# Patient Record
Sex: Male | Born: 2000 | Race: White | Hispanic: No | Marital: Single | Smoking: Never smoker
Health system: Southern US, Community
[De-identification: ages and names within clinical notes are randomized; demographics above are authoritative.]

---
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
## 2020-05-30 ENCOUNTER — Other Ambulatory Visit: Payer: Self-pay

## 2020-05-30 ENCOUNTER — Ambulatory Visit
Admission: EM | Admit: 2020-05-30 | Discharge: 2020-05-30 | Disposition: A | Discharge status: Home / Self Care | Payer: BC Managed Care – PPO | Attending: Family Medicine | Admitting: Family Medicine

## 2020-05-30 DIAGNOSIS — J039 Acute tonsillitis, unspecified: Secondary | ICD-10-CM | POA: Insufficient documentation

## 2020-05-30 DIAGNOSIS — Z1152 Encounter for screening for COVID-19: Secondary | ICD-10-CM

## 2020-05-30 LAB — GROUP A STREP BY PCR: Group A Strep by PCR: NOT DETECTED

## 2020-05-30 MED ORDER — AZITHROMYCIN 250 MG PO TABS: ORAL_TABLET | ORAL | 0 refills | Status: AC

## 2020-05-30 MED ORDER — PREDNISONE 50 MG PO TABS: ORAL_TABLET | ORAL | 0 refills | Status: AC

## 2020-05-30 MED ORDER — LIDOCAINE VISCOUS HCL 2 % MT SOLN: OROMUCOSAL | 0 refills | Status: AC

## 2020-05-30 NOTE — Discharge Instructions (Signed)
You need to drink lots of fluids.  Medication as prescribed.  Take care  Dr. Adriana Simas

## 2020-05-30 NOTE — ED Triage Notes (Addendum)
Patient presents to Urgent Care with complaints of HA since on Weds and on Friday woke up with sore throat, nasal congestion.  Reports low grade fever started yesterday. Treating symptoms with Mucinex, tylenol, OTC sore throat meds. Had a negative rapid covid test Friday.

## 2020-05-30 NOTE — ED Provider Notes (Signed)
[facility]    CSN: [id number] Arrival date & time: 05/30/20  2774      History   Chief Complaint Chief Complaint  Patient presents with  . Sore Throat  . Headache   HPI  20 year old male presents with the above complaints.  Patient states that he developed headache on Wednesday.  Subsequently developed sore throat and congestion on Friday.  He reports severe sore throat.  Difficulty swallowing.  He is not taking much by mouth.  He has taken some over-the-counter Mucinex, Tylenol, and used nasal saline without resolution.  His sore throat is worse when he swallows.  Has recently had a negative Covid test.  Pain currently 8/10 in severity.  No other reported symptoms.  No other complaints.  Home Medications    Prior to Admission medications   Medication Sig Start Date End Date Taking? Authorizing Provider  azithromycin (ZITHROMAX) 250 MG tablet 2 tablets on day 1, then 1 tablet daily on days 2-5. 05/30/20  Yes Kellan Boehlke G, DO  lidocaine (XYLOCAINE) 2 % solution Gargle 15 mL every 3 hours as needed. May swallow if desired. 05/30/20  Yes Eduarda Scrivens G, DO  predniSONE (DELTASONE) 50 MG tablet 1 tablet daily x 5 days 05/30/20  Yes Tommie Sams, DO    Family History Family History  Problem Relation Age of Onset  . Healthy Mother   . Healthy Father     Social History Social History   Tobacco Use  . Smoking status: Never Smoker  . Smokeless tobacco: Never Used  Vaping Use  . Vaping Use: Some days  Substance Use Topics  . Alcohol use: Yes    Comment: social drinker   . Drug use: Never     Allergies   Patient has no known allergies.   Review of Systems Review of Systems  HENT: Positive for congestion and sore throat.   Neurological: Positive for headaches.   Physical Exam Triage Vital Signs ED Triage Vitals  Enc Vitals Group     BP 05/30/20 0841 132/68     Pulse Rate 05/30/20 0841 64     Resp 05/30/20 0838 16     Temp 05/30/20 0841 97.6 F (36.4  C)     Temp Source 05/30/20 0841 Oral     SpO2 05/30/20 0841 99 %     Weight 05/30/20 0840 170 lb (77.1 kg)     Height --      Head Circumference --      Peak Flow --      Pain Score 05/30/20 0840 8     Pain Loc --      Pain Edu? --      Excl. in GC? --    Updated Vital Signs BP 132/68 (BP Location: Right Arm)   Pulse 64   Temp 97.6 F (36.4 C) (Oral)   Resp 16   Wt 77.1 kg   SpO2 99%   Visual Acuity Right Eye Distance:   Left Eye Distance:   Bilateral Distance:    Right Eye Near:   Left Eye Near:    Bilateral Near:     Physical Exam Vitals and nursing note reviewed.  Constitutional:      General: He is not in acute distress. HENT:     Head: Normocephalic and atraumatic.     Ears:     Comments: Right TM with effusion.  Left TM normal.    Nose: Congestion present.     Mouth/Throat:  Pharynx: Uvula midline. Posterior oropharyngeal erythema present.     Tonsils: Tonsillar exudate present. 3+ on the right. 3+ on the left.  Cardiovascular:     Rate and Rhythm: Normal rate and regular rhythm.  Pulmonary:     Effort: Pulmonary effort is normal.     Breath sounds: Normal breath sounds. No wheezing, rhonchi or rales.  Neurological:     Mental Status: He is alert.  Psychiatric:        Mood and Affect: Mood normal.        Behavior: Behavior normal.    UC Treatments / Results  Labs (all labs ordered are listed, but only abnormal results are displayed) Labs Reviewed  GROUP A STREP BY PCR  SARS CORONAVIRUS 2 (TAT 6-24 HRS)    EKG   Radiology No results found.  Procedures Procedures (including critical care time)  Medications Ordered in UC Medications - No data to display  Initial Impression / Assessment and Plan / UC Course  I have reviewed the triage vital signs and the nursing notes.  Pertinent labs & imaging results that were available during my care of the patient were reviewed by me and considered in my medical decision making (see chart for  details).    20 year old male presents with exudative tonsillitis.  Strep negative.  Awaiting Covid test result.  Mono versus atypical bacteria/other strep species.  Did not proceed with mono test today as I anticipate it will be negative given the short duration of his symptoms.  Placing empirically on azithromycin.  Prednisone given severe pharyngitis.  Viscous lidocaine as directed.  Advised to drink lots of fluids.  If he is unable to do so, he will need to go to the ER for intravenous fluids.  Final Clinical Impressions(s) / UC Diagnoses   Final diagnoses:  Encounter for screening for COVID-19  Exudative tonsillitis     Discharge Instructions     You need to drink lots of fluids.  Medication as prescribed.  Take care  Dr. Adriana Simas     ED Prescriptions    Medication Sig Dispense Auth. Provider   azithromycin (ZITHROMAX) 250 MG tablet 2 tablets on day 1, then 1 tablet daily on days 2-5. 6 tablet Mayerli Kirst G, DO   predniSONE (DELTASONE) 50 MG tablet 1 tablet daily x 5 days 5 tablet Janzen Sacks G, DO   lidocaine (XYLOCAINE) 2 % solution Gargle 15 mL every 3 hours as needed. May swallow if desired. 200 mL Tommie Sams, DO     PDMP not reviewed this encounter.   Tommie Sams, [place] 05/30/20 [phone]

## 2020-05-31 LAB — SARS CORONAVIRUS 2 (TAT 6-24 HRS): SARS Coronavirus 2: POSITIVE — AB

## 2020-06-01 ENCOUNTER — Other Ambulatory Visit (HOSPITAL_COMMUNITY): Payer: Self-pay | Admitting: Adult Health

## 2020-06-01 DIAGNOSIS — U071 COVID-19: Secondary | ICD-10-CM

## 2020-06-01 NOTE — Progress Notes (Signed)
I connected by phone with Johnny Young on 06/01/2020 at 12:53 PM to discuss the potential use of a new treatment for mild to moderate COVID-19 viral infection in non-hospitalized patients.  This patient is a 20 y.o. male that meets the FDA criteria for Emergency Use Authorization of COVID monoclonal antibody sotrovimab.  Has a (+) direct SARS-CoV-2 viral test result  Has mild or moderate COVID-19   Is NOT hospitalized due to COVID-19  Is within 10 days of symptom onset  Has at least one of the high risk factor(s) for progression to severe COVID-19 and/or hospitalization as defined in EUA.  Specific high risk criteria : BMI > 25   Sx onset 2/24   I have spoken and communicated the following to the patient or parent/caregiver regarding COVID monoclonal antibody treatment:  1. FDA has authorized the emergency use for the treatment of mild to moderate COVID-19 in adults and pediatric patients with positive results of direct SARS-CoV-2 viral testing who are 99 years of age and older weighing at least 40 kg, and who are at high risk for progressing to severe COVID-19 and/or hospitalization.  2. The significant known and potential risks and benefits of COVID monoclonal antibody, and the extent to which such potential risks and benefits are unknown.  3. Information on available alternative treatments and the risks and benefits of those alternatives, including clinical trials.  4. Patients treated with COVID monoclonal antibody should continue to self-isolate and use infection control measures (e.g., wear mask, isolate, social distance, avoid sharing personal items, clean and disinfect "high touch" surfaces, and frequent handwashing) according to CDC guidelines.   5. The patient or parent/caregiver has the option to accept or refuse COVID monoclonal antibody treatment.  After reviewing this information with the patient, the patient has agreed to receive one of the available covid 19 monoclonal  antibodies and will be provided an appropriate fact sheet prior to infusion. Noreene Filbert, NP 06/01/2020 12:53 PM

## 2020-06-02 ENCOUNTER — Ambulatory Visit (HOSPITAL_COMMUNITY)
Admission: RE | Admit: 2020-06-02 | Discharge: 2020-06-02 | Disposition: A | Discharge status: Home / Self Care | Payer: BC Managed Care – PPO | Source: Ambulatory Visit | Attending: Pulmonary Disease | Admitting: Pulmonary Disease

## 2020-06-02 DIAGNOSIS — Z6825 Body mass index (BMI) 25.0-25.9, adult: Secondary | ICD-10-CM | POA: Insufficient documentation

## 2020-06-02 DIAGNOSIS — U071 COVID-19: Secondary | ICD-10-CM | POA: Diagnosis present

## 2020-06-02 MED ORDER — SODIUM CHLORIDE 0.9 % IV SOLN: INTRAVENOUS | Status: DC | PRN

## 2020-06-02 MED ORDER — EPINEPHRINE 0.3 MG/0.3ML IJ SOAJ: 0.3000 mg | Freq: Once | INTRAMUSCULAR | Status: DC | PRN

## 2020-06-02 MED ORDER — FAMOTIDINE IN NACL 20-0.9 MG/50ML-% IV SOLN: 20.0000 mg | Freq: Once | INTRAVENOUS | Status: DC | PRN

## 2020-06-02 MED ORDER — SODIUM CHLORIDE 0.9 % IV BOLUS
1000.0000 mL | Freq: Once | INTRAVENOUS | Status: AC
  Administered 2020-06-02: 1000 mL via INTRAVENOUS

## 2020-06-02 MED ORDER — SOTROVIMAB 500 MG/8ML IV SOLN
500.0000 mg | Freq: Once | INTRAVENOUS | Status: AC
  Administered 2020-06-02: 500 mg via INTRAVENOUS

## 2020-06-02 MED ORDER — DIPHENHYDRAMINE HCL 50 MG/ML IJ SOLN: 50.0000 mg | Freq: Once | INTRAMUSCULAR | Status: DC | PRN

## 2020-06-02 MED ORDER — METHYLPREDNISOLONE SODIUM SUCC 125 MG IJ SOLR: 125.0000 mg | Freq: Once | INTRAMUSCULAR | Status: DC | PRN

## 2020-06-02 MED ORDER — ALBUTEROL SULFATE HFA 108 (90 BASE) MCG/ACT IN AERS: 2.0000 | INHALATION_SPRAY | Freq: Once | RESPIRATORY_TRACT | Status: DC | PRN

## 2020-06-02 MED FILL — MAGIC MW (LID/NYS/MAA/BEN): 12 days supply | Qty: 240 | Fill #0

## 2020-06-02 NOTE — Progress Notes (Signed)
Diagnosis: COVID-19  Physician: Dr. Patrick Wright  Procedure: Covid Infusion Clinic Med: Sotrovimab infusion - Provided patient with sotrovimab fact sheet for patients, parents, and caregivers prior to infusion.   Complications: No immediate complications noted  Discharge: Discharged home    

## 2020-06-02 NOTE — Progress Notes (Signed)
Patient reviewed Fact Sheet for Patients, Parents, and Caregivers for Emergency Use Authorization (EUA) of sotrovimab for the Treatment of Coronavirus. Patient also reviewed and is agreeable to the estimated cost of treatment. Patient is agreeable to proceed.   

## 2020-06-02 NOTE — Discharge Instructions (Signed)

## 2021-11-05 ENCOUNTER — Emergency Department: Payer: BC Managed Care – PPO

## 2021-11-05 ENCOUNTER — Other Ambulatory Visit: Payer: Self-pay

## 2021-11-05 ENCOUNTER — Emergency Department
Admission: EM | Admit: 2021-11-05 | Discharge: 2021-11-05 | Disposition: A | Discharge status: Home / Self Care | Payer: BC Managed Care – PPO | Attending: Emergency Medicine | Admitting: Emergency Medicine

## 2021-11-05 ENCOUNTER — Encounter: Payer: Self-pay | Admitting: Emergency Medicine

## 2021-11-05 DIAGNOSIS — W010XXA Fall on same level from slipping, tripping and stumbling without subsequent striking against object, initial encounter: Secondary | ICD-10-CM | POA: Insufficient documentation

## 2021-11-05 DIAGNOSIS — S52572A Other intraarticular fracture of lower end of left radius, initial encounter for closed fracture: Secondary | ICD-10-CM | POA: Insufficient documentation

## 2021-11-05 DIAGNOSIS — M25532 Pain in left wrist: Secondary | ICD-10-CM | POA: Diagnosis present

## 2021-11-05 MED ORDER — OXYCODONE-ACETAMINOPHEN 5-325 MG PO TABS: 1.0000 | ORAL_TABLET | Freq: Four times a day (QID) | ORAL | 0 refills | Status: AC | PRN

## 2021-11-05 MED ORDER — OXYCODONE-ACETAMINOPHEN 5-325 MG PO TABS
1.0000 | ORAL_TABLET | ORAL | Status: DC | PRN
  Administered 2021-11-05: 1 via ORAL
  Filled 2021-11-05: qty 1

## 2021-11-05 MED ORDER — OXYCODONE-ACETAMINOPHEN 5-325 MG PO TABS
1.0000 | ORAL_TABLET | Freq: Once | ORAL | Status: AC
  Administered 2021-11-05: 1 via ORAL
  Filled 2021-11-05: qty 1

## 2021-11-05 NOTE — ED Triage Notes (Signed)
Pt via POV from home. Pt was on top of his pressure washer and fell off. States that he tried to brace his fall with his L wrist. Obvious deformity to the L wrist. Pt is A&OX4 and NAD.

## 2021-11-05 NOTE — ED Provider Notes (Signed)
[facility] Provider Note    Event Date/Time   First MD Initiated Contact with Patient 11/05/21 1633     (approximate)   History   Wrist Pain   HPI  Johnny Young is a 21 y.o. male ports no major past medical history no allergies to medications  Earlier today he was standing on top of his mobile pressure washer.  He slipped and fell landed on his outstretched left hand.  Denies any other injury did not strike his head no other pain or problems but immediate pain and had deformity of his wrist he actually pulled on it some and got into better alignment before he came to the ER  Denies numbness or tingling except he was feeling numb after he had ice on it for about an hour, but the numbness is since gone away.  Pain is improved after taking a Percocet tablet.  No other injury.  Denies injury to the hand.  Able to move and feel all fingers in the left hand.  No shoulder pain or elbow pain.  He did suffer a very small cut to his right thumb just a small abrasion which bled just briefly and has improved.  Denies that it hurts and is able to use his right hand normally     Physical Exam   Triage Vital Signs: ED Triage Vitals  Enc Vitals Group     BP 11/05/21 1347 (!) 120/45     Pulse Rate 11/05/21 1347 73     Resp 11/05/21 1347 20     Temp 11/05/21 1347 98.5 F (36.9 C)     Temp src --      SpO2 11/05/21 1347 97 %     Weight 11/05/21 1345 180 lb (81.6 kg)     Height 11/05/21 1345 6\' 1"  (1.854 m)     Head Circumference --      Peak Flow --      Pain Score 11/05/21 1345 9     Pain Loc --      Pain Edu? --      Excl. in GC? --     Most recent vital signs: Vitals:   11/05/21 1347  BP: (!) 120/45  Pulse: 73  Resp: 20  Temp: 98.5 F (36.9 C)  SpO2: 97%     General: Awake, no distress.  Parents present as well CV:  Good peripheral perfusion.  Strong left radial pulse.  Warm well perfused all fingers of the left hand Resp:  Normal effort.   Abd:  No distention.  Other:  Normocephalic atraumatic.  Fully alert  Right hand atraumatic except for a very small abrasion at the base of the right thumb bleeding controlled, able to range the thumb and right hand through full range of motion without pain or discomfort.  Left hand normal examination no tenderness or deformity of the digits or hand itself.  Reports notable pain and has moderate joint swelling at the left wrist.  No overlying edema.  It is held in good alignment, but some swelling is notable.  No major deformity but obvious injury.  No tenderness along the mid and proximal radius and ulna.     ED Results / Procedures / Treatments   Labs (all labs ordered are listed, but only abnormal results are displayed) Labs Reviewed - No data to display   EKG     RADIOLOGY Personally interpreted the patient's left wrist x-ray notable for distal radius fracture  PROCEDURES:  Critical Care performed: No  Procedures   MEDICATIONS ORDERED IN ED: Medications  oxyCODONE-acetaminophen (PERCOCET/ROXICET) 5-325 MG per tablet 1 tablet (1 tablet Oral Given 11/05/21 1351)  oxyCODONE-acetaminophen (PERCOCET/ROXICET) 5-325 MG per tablet 1 tablet (1 tablet Oral Given 11/05/21 1824)     IMPRESSION / MDM / ASSESSMENT AND PLAN / ED COURSE  I reviewed the triage vital signs and the nursing notes.                              Differential diagnosis includes, but is not limited to, wrist sprain, wrist fracture, abrasion to the base of the right thumb, etc  No head or neck injury.  No injury to the remainder of the body save the left wrist.  Reassuring examination with normal and intact median ulnar and radial motor and neurosensory examination of the left hand as well as normal vascular exam.  Discussed case in Dr. Okey Dupre of orthopedics reviewed imaging, he advises to use splint and have patient follow-up for likely surgical intervention as an outpatient with Dr. Stephenie Acres on Tuesday of  this week.  I conveyed this recommendation to the patient and his family, father already familiar with Dr. Stephenie Acres from previous, and they are agreeable to follow-up.  Discussed careful return precautions especially precautions around left wrist.  Patient reports he had a tetanus shot just a couple months ago  Patient's presentation is most consistent with acute presentation with potential threat to life or bodily function.  ----------------------------------------- 5:21 PM on 11/05/2021 ----------------------------------------- Pain currently well controlled with Percocet. I will prescribe the patient a narcotic pain medicine due to their condition which I anticipate will cause at least moderate pain short term. I discussed with the patient safe use of narcotic pain medicines, and that they are not to drive, work in dangerous areas, or ever take more than prescribed (no more than 1 pill every 6 hours). We discussed that this is the type of medication that can be  overdosed on and the risks of this type of medicine. Patient is very agreeable to only use as prescribed and to never use more than prescribed. ----------------------------------------- 6:26 PM on 11/05/2021 -----------------------------------------  Excellent neurovascular motor status post-splint LUE.  Pain starting to increase slightly, additional Percocet given.  Normal capillary refill all digits  Return precautions and treatment recommendations and follow-up discussed with the patient who is agreeable with the plan.    FINAL CLINICAL IMPRESSION(S) / ED DIAGNOSES   Final diagnoses:  Other closed intra-articular fracture of distal end of left radius, initial encounter     Rx / DC Orders   ED Discharge Orders          Ordered    oxyCODONE-acetaminophen (PERCOCET) 5-325 MG tablet  Every 6 hours PRN        11/05/21 1825             Note:  This document was prepared using Dragon voice recognition software and may  include unintentional dictation errors.   Sharyn Creamer, MD 11/05/21 [phone]
